# Patient Record
Sex: Female | Born: 1935 | Race: White | Hispanic: No | Smoking: Never smoker
Health system: Southern US, Community
[De-identification: ages and names within clinical notes are randomized; demographics above are authoritative.]

## PROBLEM LIST (undated history)

## (undated) DIAGNOSIS — I10 Essential (primary) hypertension: Secondary | ICD-10-CM

## (undated) HISTORY — PX: CHOLECYSTECTOMY: SHX55

---

## 2006-12-29 ENCOUNTER — Other Ambulatory Visit: Payer: Self-pay

## 2006-12-30 ENCOUNTER — Inpatient Hospital Stay: Payer: Self-pay | Admitting: Internal Medicine

## 2007-01-07 ENCOUNTER — Ambulatory Visit: Payer: Self-pay | Admitting: General Surgery

## 2007-06-08 ENCOUNTER — Ambulatory Visit: Payer: Self-pay | Admitting: Internal Medicine

## 2009-04-26 ENCOUNTER — Ambulatory Visit: Payer: Self-pay | Admitting: Family Medicine

## 2009-04-27 ENCOUNTER — Ambulatory Visit: Payer: Self-pay | Admitting: Internal Medicine

## 2010-11-06 ENCOUNTER — Ambulatory Visit: Payer: Self-pay | Admitting: Internal Medicine

## 2013-01-25 ENCOUNTER — Emergency Department: Payer: Self-pay | Admitting: Emergency Medicine

## 2013-01-25 LAB — URINALYSIS, COMPLETE
Bacteria: NONE SEEN
Bilirubin,UR: NEGATIVE
Blood: NEGATIVE
Glucose,UR: NEGATIVE mg/dL (ref 0–75)
Ketone: NEGATIVE
Nitrite: POSITIVE
RBC,UR: 2 /HPF (ref 0–5)

## 2013-01-25 LAB — CBC
HCT: 43 % (ref 35.0–47.0)
MCH: 32.4 pg (ref 26.0–34.0)
MCHC: 34.3 g/dL (ref 32.0–36.0)
Platelet: 148 10*3/uL — ABNORMAL LOW (ref 150–440)

## 2013-01-25 LAB — COMPREHENSIVE METABOLIC PANEL
Albumin: 3.6 g/dL (ref 3.4–5.0)
Alkaline Phosphatase: 96 U/L (ref 50–136)
Co2: 28 mmol/L (ref 21–32)
Creatinine: 0.86 mg/dL (ref 0.60–1.30)
EGFR (African American): >60
Glucose: 96 mg/dL (ref 65–99)
Osmolality: 280 (ref 275–301)
Potassium: 4.1 mmol/L (ref 3.5–5.1)

## 2013-03-24 LAB — CBC
HCT: 41.2 % (ref 35.0–47.0)
HGB: 14 g/dL (ref 12.0–16.0)
MCH: 31.9 pg (ref 26.0–34.0)
Platelet: 174 10*3/uL (ref 150–440)
RBC: 4.41 10*6/uL (ref 3.80–5.20)
WBC: 15.3 10*3/uL — ABNORMAL HIGH (ref 3.6–11.0)

## 2013-03-24 LAB — URINALYSIS, COMPLETE
Ph: 7 (ref 4.5–8.0)
Protein: 30
Specific Gravity: 1.019 (ref 1.003–1.030)
WBC UR: 2 /HPF (ref 0–5)

## 2013-03-24 LAB — BASIC METABOLIC PANEL
Anion Gap: 5 — ABNORMAL LOW (ref 7–16)
Chloride: 102 mmol/L (ref 98–107)
Co2: 30 mmol/L (ref 21–32)
Creatinine: 0.95 mg/dL (ref 0.60–1.30)
EGFR (African American): 59 — ABNORMAL LOW
EGFR (Non-African Amer.): 51 — ABNORMAL LOW
Glucose: 125 mg/dL — ABNORMAL HIGH (ref 65–99)
Osmolality: 276 (ref 275–301)
Potassium: 3.8 mmol/L (ref 3.5–5.1)
Sodium: 137 mmol/L (ref 136–145)

## 2013-03-24 LAB — PRO B NATRIURETIC PEPTIDE: B-Type Natriuretic Peptide: 5458 pg/mL — ABNORMAL HIGH (ref 0–450)

## 2013-03-25 ENCOUNTER — Inpatient Hospital Stay: Payer: Self-pay | Admitting: Family Medicine

## 2013-03-25 DIAGNOSIS — I359 Nonrheumatic aortic valve disorder, unspecified: Secondary | ICD-10-CM

## 2013-03-26 LAB — CBC WITH DIFFERENTIAL/PLATELET
Basophil #: 0.1 10*3/uL (ref 0.0–0.1)
Basophil %: 0.9 %
Eosinophil #: 0 10*3/uL (ref 0.0–0.7)
Eosinophil %: 0 %
HCT: 35.5 % (ref 35.0–47.0)
Lymphocyte #: 1.9 10*3/uL (ref 1.0–3.6)
Lymphocyte %: 17.4 %
MCHC: 34.7 g/dL (ref 32.0–36.0)
MCV: 93 fL (ref 80–100)
Monocyte #: 1.1 x10 3/mm — ABNORMAL HIGH (ref 0.2–0.9)
Monocyte %: 10 %
Neutrophil #: 8 10*3/uL — ABNORMAL HIGH (ref 1.4–6.5)
Neutrophil %: 71.7 %
Platelet: 136 10*3/uL — ABNORMAL LOW (ref 150–440)
RBC: 3.8 10*6/uL (ref 3.80–5.20)

## 2013-03-26 LAB — BASIC METABOLIC PANEL
BUN: 12 mg/dL (ref 7–18)
Calcium, Total: 8.8 mg/dL (ref 8.5–10.1)
Chloride: 105 mmol/L (ref 98–107)
Co2: 26 mmol/L (ref 21–32)
Osmolality: 273 (ref 275–301)
Potassium: 3.2 mmol/L — ABNORMAL LOW (ref 3.5–5.1)

## 2013-03-29 LAB — BASIC METABOLIC PANEL
Calcium, Total: 9.7 mg/dL (ref 8.5–10.1)
Chloride: 106 mmol/L (ref 98–107)
Co2: 32 mmol/L (ref 21–32)
Creatinine: 0.68 mg/dL (ref 0.60–1.30)
EGFR (Non-African Amer.): >60
Glucose: 91 mg/dL (ref 65–99)
Osmolality: 284 (ref 275–301)
Sodium: 143 mmol/L (ref 136–145)

## 2013-03-29 LAB — PLATELET COUNT: Platelet: 177 10*3/uL (ref 150–440)

## 2013-07-10 ENCOUNTER — Inpatient Hospital Stay: Payer: Self-pay | Admitting: Internal Medicine

## 2013-07-10 LAB — BASIC METABOLIC PANEL
Anion Gap: 2 — ABNORMAL LOW (ref 7–16)
BUN: 13 mg/dL (ref 7–18)
Calcium, Total: 9.7 mg/dL (ref 8.5–10.1)
Chloride: 102 mmol/L (ref 98–107)
Co2: 29 mmol/L (ref 21–32)
Creatinine: 0.76 mg/dL (ref 0.60–1.30)
EGFR (African American): >60
EGFR (Non-African Amer.): >60
Glucose: 119 mg/dL — ABNORMAL HIGH (ref 65–99)
Osmolality: 268 (ref 275–301)
Potassium: 3.6 mmol/L (ref 3.5–5.1)
Sodium: 133 mmol/L — ABNORMAL LOW (ref 136–145)

## 2013-07-10 LAB — CBC WITH DIFFERENTIAL/PLATELET
Basophil #: 0 10*3/uL (ref 0.0–0.1)
Basophil %: 0.3 %
Eosinophil #: 0 10*3/uL (ref 0.0–0.7)
Eosinophil %: 0 %
HCT: 43.1 % (ref 35.0–47.0)
HGB: 14.4 g/dL (ref 12.0–16.0)
Lymphocyte #: 1.4 10*3/uL (ref 1.0–3.6)
Lymphocyte %: 14.5 %
MCH: 31.6 pg (ref 26.0–34.0)
MCHC: 33.4 g/dL (ref 32.0–36.0)
MCV: 95 fL (ref 80–100)
Monocyte #: 1.3 x10 3/mm — ABNORMAL HIGH (ref 0.2–0.9)
Monocyte %: 13.8 %
Neutrophil #: 6.8 10*3/uL — ABNORMAL HIGH (ref 1.4–6.5)
Neutrophil %: 71.4 %
Platelet: 122 10*3/uL — ABNORMAL LOW (ref 150–440)
RBC: 4.56 10*6/uL (ref 3.80–5.20)
RDW: 13.6 % (ref 11.5–14.5)
WBC: 9.6 10*3/uL (ref 3.6–11.0)

## 2013-07-10 LAB — TROPONIN I: Troponin-I: 0.04 ng/mL

## 2013-07-11 LAB — CBC WITH DIFFERENTIAL/PLATELET
Basophil #: 0 10*3/uL (ref 0.0–0.1)
Basophil %: 0.3 %
Eosinophil #: 0 10*3/uL (ref 0.0–0.7)
Eosinophil %: 0 %
HCT: 39.1 % (ref 35.0–47.0)
HGB: 13.4 g/dL (ref 12.0–16.0)
Lymphocyte #: 1.2 10*3/uL (ref 1.0–3.6)
Lymphocyte %: 14.1 %
MCH: 32.1 pg (ref 26.0–34.0)
MCHC: 34.2 g/dL (ref 32.0–36.0)
MCV: 94 fL (ref 80–100)
MONO ABS: 1.1 x10 3/mm — AB (ref 0.2–0.9)
Monocyte %: 12.9 %
NEUTROS ABS: 6.4 10*3/uL (ref 1.4–6.5)
Neutrophil %: 72.7 %
Platelet: 108 10*3/uL — ABNORMAL LOW (ref 150–440)
RBC: 4.17 10*6/uL (ref 3.80–5.20)
RDW: 13.5 % (ref 11.5–14.5)
WBC: 8.8 10*3/uL (ref 3.6–11.0)

## 2013-07-11 LAB — BASIC METABOLIC PANEL
ANION GAP: 6 — AB (ref 7–16)
BUN: 9 mg/dL (ref 7–18)
CO2: 27 mmol/L (ref 21–32)
Calcium, Total: 9.7 mg/dL (ref 8.5–10.1)
Chloride: 105 mmol/L (ref 98–107)
Creatinine: 0.67 mg/dL (ref 0.60–1.30)
EGFR (Non-African Amer.): >60
Glucose: 111 mg/dL — ABNORMAL HIGH (ref 65–99)
Osmolality: 275 (ref 275–301)
POTASSIUM: 3.2 mmol/L — AB (ref 3.5–5.1)
Sodium: 138 mmol/L (ref 136–145)

## 2013-07-15 LAB — CULTURE, BLOOD (SINGLE)

## 2013-11-23 ENCOUNTER — Ambulatory Visit: Payer: Self-pay | Admitting: Internal Medicine

## 2013-11-23 LAB — URINALYSIS, COMPLETE
Bilirubin,UR: NEGATIVE
Blood: NEGATIVE
Glucose,UR: NEGATIVE mg/dL (ref 0–75)
Ketone: NEGATIVE
Nitrite: POSITIVE
Ph: 7.5 (ref 4.5–8.0)
Protein: NEGATIVE
Specific Gravity: 1.005 (ref 1.003–1.030)
WBC UR: >30 /HPF (ref 0–5)

## 2013-11-25 LAB — URINE CULTURE

## 2013-12-09 ENCOUNTER — Ambulatory Visit: Payer: Self-pay

## 2014-09-23 NOTE — Discharge Summary (Signed)
PATIENT NAME:  Olivia Smith, Olivia Smith MR#:  [id number] DATE OF BIRTH:  [birth year]  DATE OF ADMISSION:  03/25/2013 DATE OF DISCHARGE:  03/29/2013   CHIEF COMPLAINT:  Confusion and generalized weakness.   DISCHARGE DIAGNOSES: 1.  Altered mental status due to metabolic encephalopathy secondary to infection.  2.  Community-acquired pneumonia.  3.  Generalized weakness.  4.  Difficulty ambulating.  5.  Hypertension.  6.  Osteoporosis.  7.  Leukocytosis.  8.  Hypokalemia.   DISPOSITION:  Skilled nursing facility.   MEDICATIONS AT DISCHARGE: Aspirin 81 mg daily, atenolol 25 mg daily, calcium plus vitamin D once daily, losartan 25 mg daily, chondroitin with glucosamine once a day, albuterol 2.5 mg every 4 hours as needed for wheezing, Augmentin 875 mg/125 mg twice daily for 8 days,  azithromycin 250 mg once a day for 8 days.   FOLLOWUP:  With [facility] PCP at discharge from facility. For now, follow up with attending physician at skilled nursing facility in the next couple of days.   HOSPITAL COURSE: A very nice day 81 year old female with history of hypertension and osteoporosis who was admitted with weakness and confusion.   The patient presented on 03/24/2013, in the company of her daughter, who brought her in because of progressive weakness for 24 hours, mild confusion and cough.   The patient was noted to have some wheezing and an elevated d-dimer. She had a CT scan of the chest with IV contrast, negative for PE, positive for right basilar infiltrate. The patient received treatment for pneumonia with Rocephin and azithromycin. She had significant improvement right away. The patient was diagnosed with community-acquired pneumonia She had good progress and good improvement. She did not have blood cultures on admission. By the time I saw her, she was already on several days of antibiotics.   The patient had no need of supplemental oxygen. She is on room air right now. She is not able to ambulate due to  severe weakness, for which physical therapy recommended skilled nursing facility.   The patient had significant improvement of her white blood cells after antibiotics, from 15,000 to 11,000, showed some significant improvement. The patient is discharged to a facility in good condition. She spent at least 3 days in the hospital getting IV antibiotics. Now, they have been switched to oral therapy.   I spent about 40 minutes with this discharge.   ____________________________ Felipa Furnaceoberto Sanchez Gutierrez, MD rsg:dmm D: 03/29/2013 12:37:00 ET T: 03/29/2013 13:28:12 ET JOB#: [id number]  cc: Felipa Furnaceoberto Sanchez Gutierrez, MD, <Dictator> Alice Vitelli Juanda ChanceSANCHEZ GUTIERRE MD ELECTRONICALLY SIGNED 04/12/2013 6:46

## 2014-09-23 NOTE — H&P (Signed)
PATIENT NAME:  Olivia Smith, Olivia Smith MR#:  [id number] DATE OF BIRTH:  [birth year]  DATE OF ADMISSION:  03/24/2013  REFERRING PHYSICIAN: Maurilio LovelyNoelle McLaurin, M.D.  [facility].   CHIEF COMPLAINT: Weakness, confusion.   HISTORY OF PRESENT ILLNESS: This is a 81 year old female with significant past medical history of hypertension, osteoporosis. The patient lives with her daughter at home. She was brought by her daughter for progressive weakness over the last 24 hours, as well some mild confusion and cough. Upon presentation, the patient was noticed to have some wheezing. Had an elevated D-dimer. The patient had CT chest with IV contrast. Came back negative for PE but did show right basilar possible infiltrate. The patient appears to be confused. Daughter at bedside. She gives most of the medical history and review of systems. Reports usually her mother is much more sharper than that. The patient's only aware of her name. Not aware of location or date currently. Daughter reports the patient was in August in [facility] where she was diagnosed with a UTI, discharged on p.o. antibiotics, where she went back to her baseline. Daughter denies the patient having any focal deficits but being generally weak, where she did require two people to assist her to stand where usually she ambulates without assistance, and the patient cannot give any reliable review of systems. Basically, she is denying everything which does not appear to be accurate as daughter mentions her mom has been having cough for the last 2 days but no productive sputum. Reports she has been having good appetite but has been feeling weak. Denied her having any fever. Hospitalist service was requested to admit the patient for further treatment of her pneumonia.   PAST MEDICAL HISTORY:  1. Hypertension.  2. Osteoporosis.    SOCIAL HISTORY: Lives with her daughter. No history of smoking or alcohol use.   FAMILY HISTORY:  Significant for father dying of tuberculosis, her sister having a diagnosis of tuberculosis.   ALLERGIES: No known drug allergies.   HOME MEDICATIONS:  1. Aspirin 81 mg oral daily.  2. Atenolol 25 mg oral daily.  3. Losartan 25 mg oral daily.  4. Chondroitin and glucosamine 1 tablet oral daily.  5. Calcium with vitamin D 1 tablet oral daily.   REVIEW OF SYSTEMS: The patient appears to be confused. Cannot give reliable review of systems, and basically, she is answering no to every single question we ask as she denies any complaints, while daughter states otherwise.   PHYSICAL EXAMINATION:  VITAL SIGNS: Temperature 97.9, pulse 75, respiratory rate 18, blood pressure 114/62, saturating 98% on room air.  GENERAL: Frail elderly female, looks comfortable, in no apparent distress.  HEENT: Head atraumatic, normocephalic. Pupils are equal and reactive to light. Pink conjunctivae. Anicteric sclerae. Dry oral mucosa.  NECK: Supple. No thyromegaly. No JVD.  CHEST: Good air entry bilaterally. No wheezing but had bibasilar rhonchi.  CARDIOVASCULAR: S1, S2 heard. No rubs, murmurs or gallops.  ABDOMEN: Soft, nontender, nondistended. Bowel sounds present.  EXTREMITIES: No edema. No clubbing. No cyanosis. Good pedal pulses felt bilaterally.  SKIN: Has rash in the left knee area where she has been using hydrocortisone with improvement.  PSYCHIATRIC: The patient appears to be confused. Awake, alert x 1 only to name.  NEUROLOGICAL: Appears to be moving all extremities without significant deficits. Cranial nerves grossly intact.  LYMPHATIC: No cervical or supraclavicular lymphadenopathy.   PERTINENT LABORATORIES: Glucose 125, BNP 5458, BUN 14, creatinine 0.95, sodium 137, potassium 3.8, chloride  102, CO2 30. Troponin less than 0.02. White blood cells 15.3, hemoglobin 14, hematocrit 41.2, platelets 174. D-dimer 3.06. Urinalysis negative for leukocyte esterase and nitrites.   CT chest with IV contrast showing  no evidence of pulmonary embolism. No thoracic aortic aneurysm or dissection and presumed patchy areas of infiltrate at the lung bases.   ASSESSMENT AND PLAN:  1. Community-acquired pneumonia: The patient presents with weakness. Has cough with leukocytosis and evidence of bibasilar infiltrate on the CT chest. She will be started on Rocephin and Zithromax.  2. Generalized weakness, confusion: This is due to her pneumonia. Will continue with fluids. Will treat her pneumonia. Will consult physical therapy.  3. Hypertension, acceptable: Continue the patient on Cozaar and atenolol.  4. Osteoporosis: Continue with calcium and vitamin D.  5. Deep vein thrombosis prophylaxis: Subcutaneous heparin.   CODE STATUS: The daughter at bedside and reports the patient is DNR/DNI.   TOTAL TIME SPENT ON ADMISSION AND PATIENT CARE: 45 minutes.   ____________________________ Starleen Arms, MD dse:gb D: 03/25/2013 01:56:59 ET T: 03/25/2013 02:13:08 ET JOB#: [id number]  cc: Starleen Arms, MD, <Dictator> Mateen Franssen Teena Irani MD ELECTRONICALLY SIGNED 03/26/2013 3:06

## 2014-09-24 NOTE — H&P (Signed)
PATIENT NAME:  Olivia Smith, Olivia Smith MR#:  [id number] DATE OF BIRTH:  1917/03/28  DATE OF ADMISSION:  07/10/2013  PRIMARY CARE PHYSICIAN: At [facility].   REFERRING PHYSICIAN: Dr. Governor Rooks.   CHIEF COMPLAINT: Cough, shortness of breath and fever.    HISTORY OF PRESENT ILLNESS:  Olivia Smith is a 81 year old pleasant female with history of hypertension, osteoporosis, presented to the Emergency Department with complaints of cough, shortness of breath; started for the last two days. Concerning this, called yesterday [facility] where patient was given Zithromax. The patient had significantly decreased p.o. intake, has cough with productive sputum. The patient is unable to ambulate in the house. The patient continues to become extremely lethargic. Concerning this, the patient is brought to the Emergency Department. Work-up in the Emergency Department with chest x-ray showed possibly mild interstitial edema superimposed on changes of the COPD.  The patient does not have any elevated white blood cell count. The patient has had low grade temperature of 99.7, tachycardic, hypoxic with oxygen saturations in high 80s. The patient was initiated on 2 liters of oxygen. The patient received Rocephin and Zithromax in the Emergency Department.   PAST MEDICAL HISTORY: 1.  Hypertension.  2.  Osteoporosis.  3.  Previous history of pneumonia.   PAST SURGICAL HISTORY: 1.  Cholecystectomy.  2.  Hernia repair.   ALLERGIES: No known drug allergies.   HOME MEDICATIONS: 1.  Zithromax 500 mg once a day.  2.  Vitamin D3 400 units once a day.  3.  Losartan 25 mg once a day.  4.  Chondroitin glucosamine 1 tablet once a day.  5.  Aspirin 81 mg daily.   SOCIAL HISTORY:  Never smoking, drinking alcohol or using illicit drugs. Currently lives with her daughter. No secondhand exposure to smoking.   FAMILY HISTORY: Multiple family members had tuberculosis in 36s and '50s.    REVIEW OF SYSTEMS: Could not be  obtained from the patient, as the patient is extremely lethargic and confused.   PHYSICAL EXAMINATION: GENERAL: This is a frail female lying down in the bed, looks extremely lethargic.  VITAL SIGNS: Temperature 99.7, pulse 110, blood pressure 143/70, respiratory rate of 18, oxygen saturation 93% on 2 liters of oxygen.  HEENT: Head normocephalic, atraumatic. There is no scleral icterus. Conjunctivae normal. Pupils equal and react to light. Mucous membranes dry. No pharyngeal erythema.  NECK: Supple. No lymphadenopathy. No JVD. No carotid bruit.  CHEST: No focal tenderness. Bilateral crackles are heard, has bilateral rhonchi.  HEART: S1, S2, regular. Tachycardia.  EXTREMITIES: No pedal edema. Pulses 2+.  ABDOMEN: Bowel sounds present. Soft, nontender, nondistended. No hepatosplenomegaly.  SKIN: No rash or lesions, dry skin.  MUSCULOSKELETAL: Good range of motion in all the extremities.  NEUROLOGIC: The patient is alert, oriented to person, but not to place and time. Motor 5/5 in upper and lower extremities.   LABS: CBC: WBC of 9.6, hemoglobin 14.4, platelet count of 122, troponin 0.04. Chest x-ray, 1 view portable; as mentioned above, interstitial changes.   BMP is completely within normal limits.   ASSESSMENT AND PLAN: Ms. Raphael is a 81 year old female who comes to the Emergency Department with cough, productive sputum, lethargy and possible pneumonia.  1.  Pneumonia. Will treat it as a community-acquired pneumonia with Rocephin and Zithromax. Continue with IV fluids.  2. Hypertension: Currently well controlled. Will hold the blood pressure medications. The patient has decreased p.o. intake.  3.   Debility. We will involve physical therapy, occupational therapy.  4.  Keep the patient on deep vein thrombosis prophylaxis with Lovenox.   TIME SPENT: 45 minutes.    ____________________________ Susa GriffinsPadmaja Clemmie Marxen, MD pv:NTS D: 07/10/2013 22:18:00 ET T: 07/10/2013 22:57:37  ET JOB#: [id number]  cc: Susa GriffinsPadmaja Joenathan Sakuma, MD, <Dictator> Susa GriffinsPADMAJA Nyiesha Beever MD ELECTRONICALLY SIGNED 08/05/2013 1:17

## 2014-09-24 NOTE — Discharge Summary (Signed)
PATIENT NAME:  Olivia Smith, Olivia MR#:  [id number] DATE OF BIRTH:  [birth year]  DATE OF ADMISSION:  07/10/2013 DATE OF DISCHARGE:  07/14/2013  ADMISSION DIAGNOSIS: Pneumonia.   DISCHARGE DIAGNOSES: 1. Community-acquired pneumonia.  2. Acute respiratory failure.  3. Debility.  4. Hypokalemia.  5. Hyponatremia.   Chest x-ray showed a right upper lobe infiltrate consistent with pneumonia. Discharge white blood cells 8.8, hemoglobin 13.4, hematocrit 39.1, platelets are 108.  Blood cultures negative to date.   HOSPITAL COURSE: A 81 year old female who presented with respiratory failure, hypoxia, found to have a pneumonia. For further details, please refer to H and P.  1. Acute respiratory failure. The patient is admitted for community-acquired pneumonia. She was placed on antibiotics and oxygen. Please see below.  2. Community acquired pneumonia.  Chest x-ray shows a left upper lobe as well as right upper lobe pneumonia. The patient was on antibiotics Rocephin and azithromycin, will be transitioned to Levaquin at discharge. She does need oxygen at discharge. Blood cultures negative to date.  3. Debility. Physical therapy recommends skilled nursing.  4. Hypokalemia. The patient was repleted.  5. Hyponatremia, improved with IV fluids.   PHYSICAL EXAMINATION: VITAL SIGNS: Temperature 98, pulse 86 to 100, respirations 20, blood pressure 146 to 172 over 80 to 87, 92% on 2 liters.  GENERAL: The patient is alert and oriented. She is hard of hearing, does not appear to be in acute distress.  HEENT: Pupils are round and reactive.   NECK: Supple. There is no JVD, carotid bruit.  CARDIOVASCULAR: Tachycardia without murmur, gallops, or rubs. No lower extremity edema.  RESPIRATORY: The patient has normal respiratory effort, no use of accessory muscles. She has some rhonchi and crackles at the right and left base.  ABDOMEN: Bowel sounds are positive. Nontender, and nondistended.  EXTREMITIES: No edema.   SKIN: Without ulcers or rashes.  NEUROLOGIC: Cranial nerves II through XII are intact. There is no tremor. Follows commands. She has generalized weakness.  PSYCHIATRIC: She is alert.   DISCHARGE MEDICATIONS: 1. Vitamin D3 400 international units daily.  2. Chondroitin  1200/1500 daily.  3. Losartan 25 mg daily.  4. Aspirin 81 mg daily.  5. Levaquin 750 mg q.24 hours x 7 days.  6. Discharge oxygen: 2 liters nasal cannula keeping sats greater than 92%.   DISCHARGE SUPPLEMENT: Ensure once a day.   DISCHARGE DIET: Regular diet.   DISCHARGE ACTIVITY: As tolerated.   DISCHARGE REFERRAL: Physical therapy.  The patient is  medically stable for discharge.   TIME SPENT: Approximately 35 minutes.  ____________________________ Janyth ContesSital P. Juliene PinaMody, MD spm:sg D: 07/14/2013 12:43:13 ET T: 07/14/2013 13:11:59 ET JOB#: [id number]  cc: Jilberto Vanderwall P. Juliene PinaMody, MD, <Dictator> [facility]  Janyth ContesSITAL P Leonel Mccollum MD ELECTRONICALLY SIGNED 07/14/2013 14:36

## 2015-02-27 ENCOUNTER — Emergency Department: Payer: Medicare Other

## 2015-02-27 ENCOUNTER — Emergency Department
Admission: EM | Admit: 2015-02-27 | Discharge: 2015-02-27 | Disposition: A | Discharge status: Home / Self Care | Payer: Medicare Other | Attending: Emergency Medicine | Admitting: Emergency Medicine

## 2015-02-27 DIAGNOSIS — I10 Essential (primary) hypertension: Secondary | ICD-10-CM | POA: Insufficient documentation

## 2015-02-27 DIAGNOSIS — R05 Cough: Secondary | ICD-10-CM | POA: Diagnosis not present

## 2015-02-27 DIAGNOSIS — R410 Disorientation, unspecified: Secondary | ICD-10-CM | POA: Insufficient documentation

## 2015-02-27 DIAGNOSIS — R4182 Altered mental status, unspecified: Secondary | ICD-10-CM | POA: Diagnosis present

## 2015-02-27 HISTORY — DX: Essential (primary) hypertension: I10

## 2015-02-27 LAB — CBC
HEMATOCRIT: 39.8 % (ref 35.0–47.0)
HEMOGLOBIN: 13.2 g/dL (ref 12.0–16.0)
MCH: 30.8 pg (ref 26.0–34.0)
MCHC: 33.2 g/dL (ref 32.0–36.0)
MCV: 92.8 fL (ref 80.0–100.0)
Platelets: 150 10*3/uL (ref 150–440)
RBC: 4.29 MIL/uL (ref 3.80–5.20)
RDW: 13.5 % (ref 11.5–14.5)
WBC: 9.1 10*3/uL (ref 3.6–11.0)

## 2015-02-27 LAB — BASIC METABOLIC PANEL
Anion gap: 7 (ref 5–15)
BUN: 13 mg/dL (ref 6–20)
CHLORIDE: 103 mmol/L (ref 101–111)
CO2: 27 mmol/L (ref 22–32)
CREATININE: 0.76 mg/dL (ref 0.44–1.00)
Calcium: 10.1 mg/dL (ref 8.9–10.3)
GFR calc Af Amer: >60 mL/min (ref >60)
GFR calc non Af Amer: >60 mL/min (ref >60)
GLUCOSE: 104 mg/dL — AB (ref 65–99)
POTASSIUM: 4.2 mmol/L (ref 3.5–5.1)
SODIUM: 137 mmol/L (ref 135–145)

## 2015-02-27 LAB — URINALYSIS COMPLETE WITH MICROSCOPIC (ARMC ONLY)
BACTERIA UA: NONE SEEN
Bilirubin Urine: NEGATIVE
Glucose, UA: NEGATIVE mg/dL
KETONES UR: NEGATIVE mg/dL
Leukocytes, UA: NEGATIVE
Nitrite: NEGATIVE
PH: 6 (ref 5.0–8.0)
PROTEIN: NEGATIVE mg/dL
Specific Gravity, Urine: 1.01 (ref 1.005–1.030)

## 2015-02-27 NOTE — Discharge Instructions (Signed)
Confusion Confusion is the inability to think with your usual speed or clarity. Confusion may come on quickly or slowly over time. How quickly the confusion comes on depends on the cause. Confusion can be due to any number of causes. CAUSES   Concussion, head injury, or head trauma.  Seizures.  Stroke.  Fever.  Brain tumor.  Age related decreased brain function (dementia).  Heightened emotional states like rage or terror.  Mental illness in which the person loses the ability to determine what is real and what is not (hallucinations).  Infections such as a urinary tract infection (UTI).  Toxic effects from alcohol, drugs, or prescription medicines.  Dehydration and an imbalance of salts in the body (electrolytes).  Lack of sleep.  Low blood sugar (diabetes).  Low levels of oxygen from conditions such as chronic lung disorders.  Drug interactions or other medicine side effects.  Nutritional deficiencies, especially niacin, thiamine, vitamin C, or vitamin B.  Sudden drop in body temperature (hypothermia).  Change in routine, such as when traveling or hospitalized. SIGNS AND SYMPTOMS  People often describe their thinking as cloudy or unclear when they are confused. Confusion can also include feeling disoriented. That means you are unaware of where or who you are. You may also not know what the date or time is. If confused, you may also have difficulty paying attention, remembering, and making decisions. Some people also act aggressively when they are confused.  DIAGNOSIS  The medical evaluation of confusion may include:  Blood and urine tests.  X-rays.  Brain and nervous system tests.  Analyzing your brain waves (electroencephalogram or EEG).  Magnetic resonance imaging (MRI) of your head.  Computed tomography (CT) scan of your head.  Mental status tests in which your health care provider may ask many questions. Some of these questions may seem silly or strange,  but they are a very important test to help diagnose and treat confusion. TREATMENT  An admission to the hospital may not be needed, but a person with confusion should not be left alone. Stay with a family member or friend until the confusion clears. Avoid alcohol, pain relievers, or sedative drugs until you have fully recovered. Do not drive until directed by your health care provider. HOME CARE INSTRUCTIONS  What family and friends can do:  To find out if someone is confused, ask the person to state his or her name, age, and the date. If the person is unsure or answers incorrectly, he or she is confused.  Always introduce yourself, no matter how well the person knows you.  Often remind the person of his or her location.  Place a calendar and clock near the confused person.  Help the person with his or her medicines. You may want to use a pill box, an alarm as a reminder, or give the person each dose as prescribed.  Talk about current events and plans for the day.  Try to keep the environment calm, quiet, and peaceful.  Make sure the person keeps follow-up visits with his or her health care provider. PREVENTION  Ways to prevent confusion:  Avoid alcohol.  Eat a balanced diet.  Get enough sleep.  Take medicine only as directed by your health care provider.  Do not become isolated. Spend time with other people and make plans for your days.  Keep careful watch on your blood sugar levels if you are diabetic. SEEK IMMEDIATE MEDICAL CARE IF:   You develop severe headaches, repeated vomiting, seizures, blackouts, or   slurred speech.  There is increasing confusion, weakness, numbness, restlessness, or personality changes.  You develop a loss of balance, have marked dizziness, feel uncoordinated, or fall.  You have delusions, hallucinations, or develop severe anxiety.  Your family members think you need to be rechecked. Document Released: 06/27/2004 Document Revised: 10/04/2013  Document Reviewed: 06/25/2013 [employer] Patient Information 2015 [employer]. This information is not intended to replace advice given to you by your health care provider. Make sure you discuss any questions you have with your health care provider.  

## 2015-02-27 NOTE — ED Provider Notes (Signed)
[facility] Emergency Department Provider Note  ____________________________________________  Time seen: Approximately 155 AM  I have reviewed the triage vital signs and the nursing notes.   HISTORY  Chief Complaint Altered Mental Status    HPI Olivia Smith is a 81 y.o. female who was brought in with confusion. EMS was called for possible stroke. The patient's daughter reports that her mother woke up to use the restroom and when she walked back to bed she was talking out of her head.The patient made a statement about how she couldn't talk to God that her daughter did not understand. She reports that she made a few other statements that didn't make any sense so she was concerned that the patient may be having a stroke so she called the ambulance. The patient's daughter also reports that this afternoon after church the patient's seemed to have some difficulty breathing and a cough. Per EMS the patient walked to the stretcher without any difficulty and had fluent speech. The patient has no complaints in the Emergency Department at this time. The patient recently completed a course of bactrim for a UTI    Past Medical History  Diagnosis Date  . Hypertension     There are no active problems to display for this patient.   Past Surgical History  Procedure Laterality Date  . Cholecystectomy      No current outpatient prescriptions on file.  Allergies Review of patient's allergies indicates no known allergies.  No family history on file.  Social History Social History  Substance Use Topics  . Smoking status: Never Smoker   . Smokeless tobacco: Never Used  . Alcohol Use: No    Review of Systems Constitutional: No fever/chills Eyes: No visual changes. ENT: No sore throat. Cardiovascular: Denies chest pain. Respiratory: Cough Gastrointestinal: No abdominal pain.  No nausea, no vomiting.  No diarrhea.  No constipation. Genitourinary: Negative for  dysuria. Musculoskeletal: Negative for back pain. Skin: Negative for rash. Neurological: Negative for headaches, focal weakness or numbness.  10-point ROS otherwise negative.  ____________________________________________   PHYSICAL EXAM:  VITAL SIGNS: ED Triage Vitals  Enc Vitals Group     BP 02/27/15 0159 152/75 mmHg     Pulse Rate 02/27/15 0159 101     Resp 02/27/15 0159 19     Temp 02/27/15 0159 98.1 F (36.7 C)     Temp Source 02/27/15 0159 Oral     SpO2 02/27/15 0159 95 %     Weight 02/27/15 0250 127 lb (57.607 kg)     Height 02/27/15 0250  (1.651 m)     Head Cir --      Peak Flow --      Pain Score 02/27/15 0202 0     Pain Loc --      Pain Edu? --      Excl. in GC? --     Constitutional: Alert and oriented to self, partially to date. Well appearing and in no acute distress. Eyes: Conjunctivae are normal. PERRL. EOMI. Head: Atraumatic. Nose: No congestion/rhinnorhea. Mouth/Throat: Mucous membranes are moist.  Oropharynx non-erythematous. Cardiovascular: Normal rate, regular rhythm. Systolic murmur.  Good peripheral circulation. Respiratory: Normal respiratory effort.  No retractions. Lungs CTAB. Gastrointestinal: Soft and nontender. No distention. Positive bowel sounds Musculoskeletal: No lower extremity tenderness nor edema.   Neurologic:  Normal speech and language. No gross focal neurologic deficits are appreciated.  Skin:  Skin is warm, dry and intact.  Psychiatric: Mood and affect are normal.  ____________________________________________   LABS (all labs ordered are listed, but only abnormal results are displayed)  Labs Reviewed  BASIC METABOLIC PANEL - Abnormal; Notable for the following:    Glucose, Bld 104 (*)    All other components within normal limits  URINALYSIS COMPLETEWITH MICROSCOPIC ([facility] ONLY) - Abnormal; Notable for the following:    Color, Urine YELLOW (*)    APPearance CLEAR (*)    Hgb urine dipstick 1+ (*)    Squamous  Epithelial / LPF 0-5 (*)    All other components within normal limits  CBC   ____________________________________________  EKG  ED ECG REPORT I, Rebecka Apley, the attending physician, personally viewed and interpreted this ECG.   Date: 02/27/2015  EKG Time: 207  Rate: 96  Rhythm: normal sinus rhythm  Axis: normal  Intervals:none  ST&T Change: none  ____________________________________________  RADIOLOGY  CT head: No acute finding or change since 2014 CXR: Chronic lung disease without acute superimposed finding ____________________________________________   PROCEDURES  Procedure(s) performed: None  Critical Care performed: No  ____________________________________________   INITIAL IMPRESSION / ASSESSMENT AND PLAN / ED COURSE  Pertinent labs & imaging results that were available during my care of the patient were reviewed by me and considered in my medical decision making (see chart for details).  This is a 81 year old female who was brought in by her daughter with some confusion earlier this evening. The patient has no specific complaints per her daughter was concerned she may be having a stroke. The patient's blood work is unremarkable as well as her CT and chest x-ray. The patient's urine also does not show any sign of infection. The patient does have some mild confusion about the specific date of the month as well as the date of her birthday but knows that she was born in [birth year] and it is Sunday in September. The patient also has no neurologic deficits at this time. Although the daughter asked if the patient can stay for the night I informed her that the patient had no specific need or reason to be kept in the hospital. I informed her that she should have the patient follow-up with her primary care physician for further evaluation of her confusion. Otherwise the patient will be discharged home to follow-up with her primary care  physician. ____________________________________________   FINAL CLINICAL IMPRESSION(S) / ED DIAGNOSES  Final diagnoses:  Confusion      Rebecka Apley, MD 02/27/15 [phone]

## 2015-02-27 NOTE — ED Notes (Signed)
Urine collected via in and out cath. Patient tolerated well. Daughter at bedside. Patient on monitor.

## 2015-02-27 NOTE — ED Notes (Signed)
Per daughter, patient was "talking out of her head tonight". Has been treated recently for a UTI. Stopped medication 2 days ago

## 2016-01-02 IMAGING — CT CT HEAD W/O CM
1 series · 16 of 30 positions shown, 20 images · non-contrast
Comparison: 01/25/2013

CLINICAL DATA: Confusion with cough and difficulty breathing.

EXAM:
CT HEAD WITHOUT CONTRAST
TECHNIQUE: Contiguous axial images were obtained from the base of the skull
through the vertex without intravenous contrast.

[Series 2: head wo · axial · 0.43mm/px · z∈[-142,-16]mm · 16 of 32 slices shown, 20 images]
[im 2/32  brain]
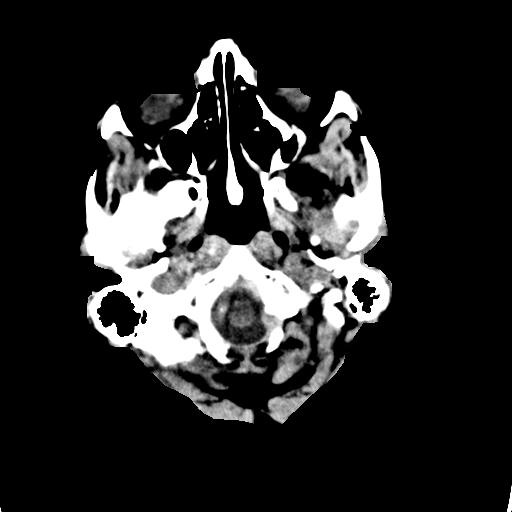
[im 2/32  bone]
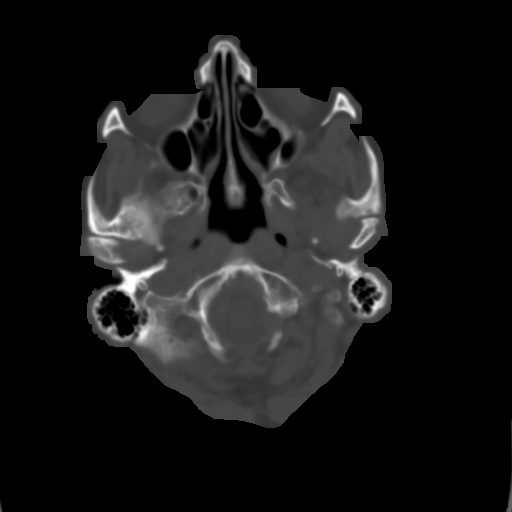
[im 4/32  brain]
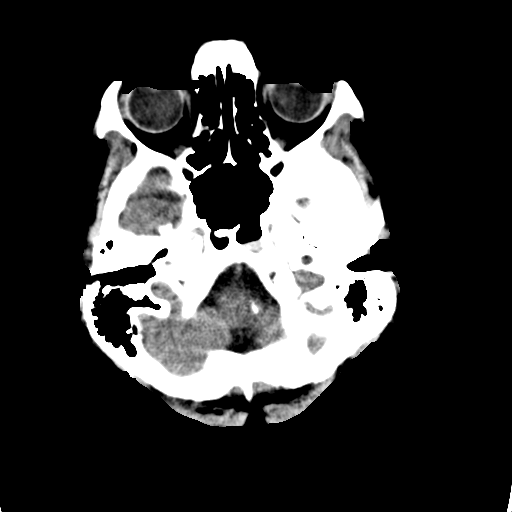
[im 6/32  brain]
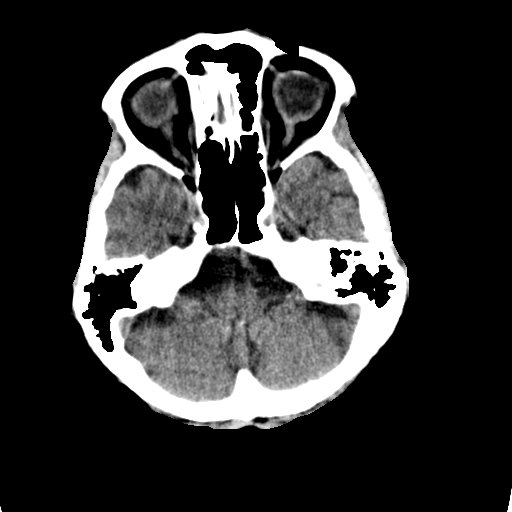
[im 8/32  brain]
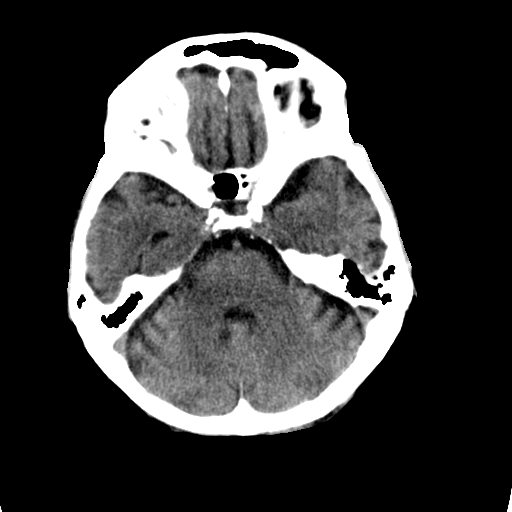
[im 9/32  brain]
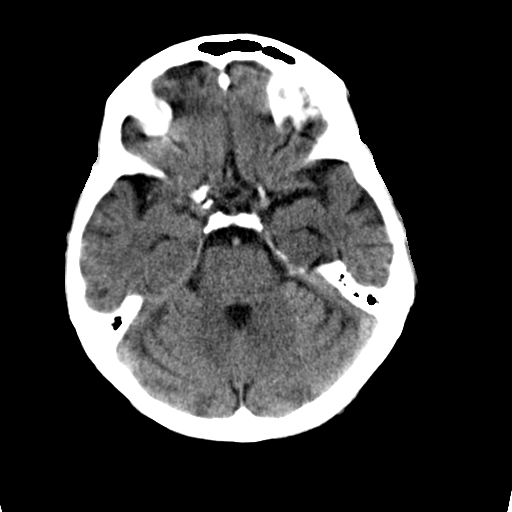
[im 9/32  bone]
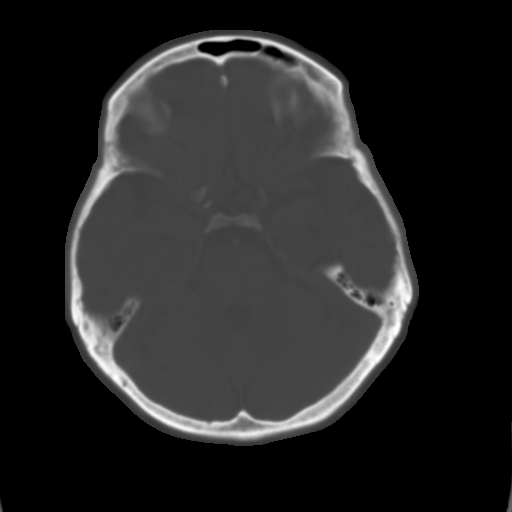
[im 11/32  brain]
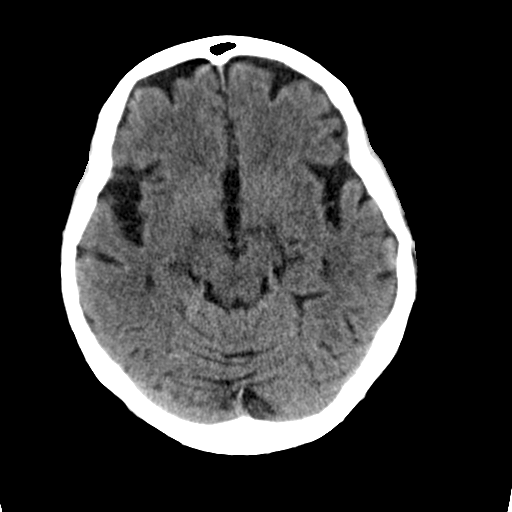
[im 13/32  brain]
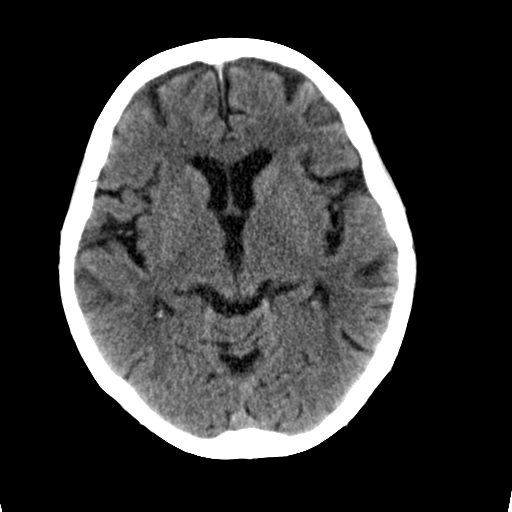
[im 15/32  brain]
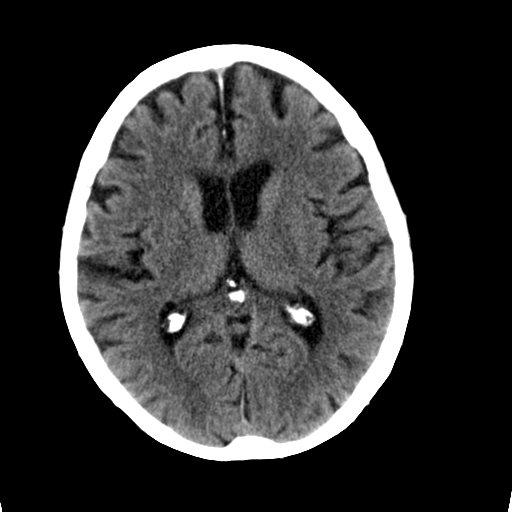
[im 17/32  brain]
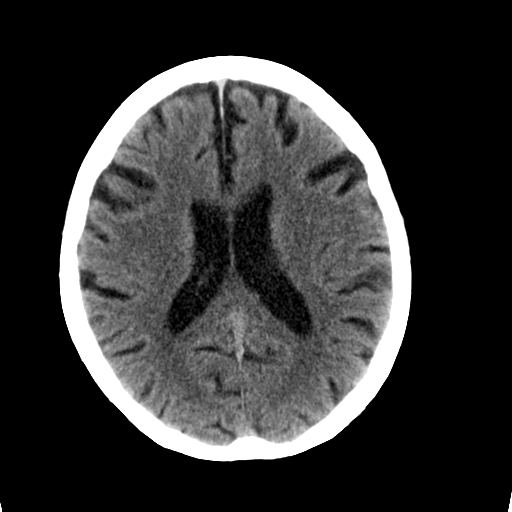
[im 17/32  bone]
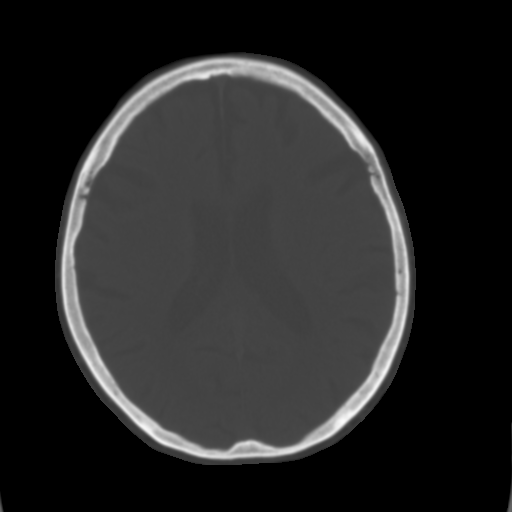
[im 19/32  brain]
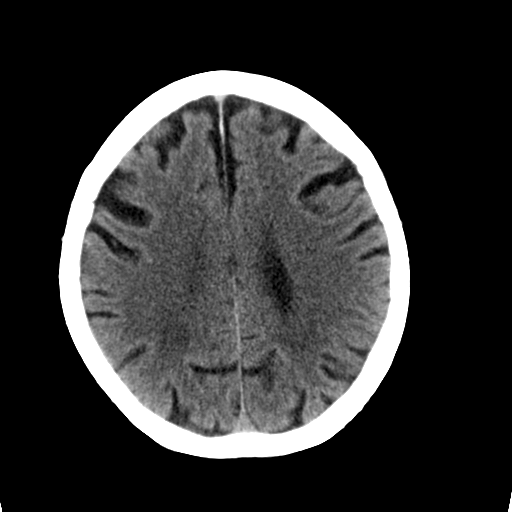
[im 21/32  brain]
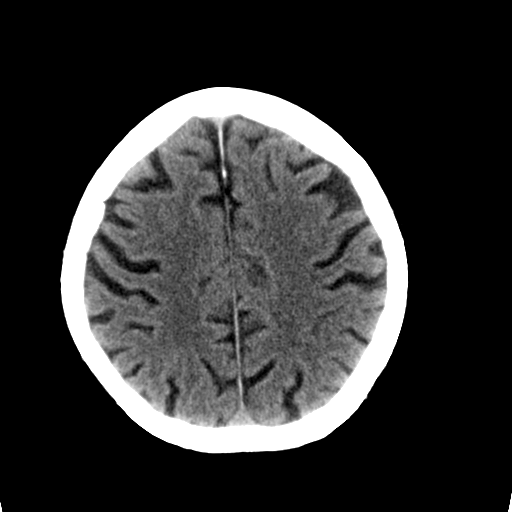
[im 23/32  brain]
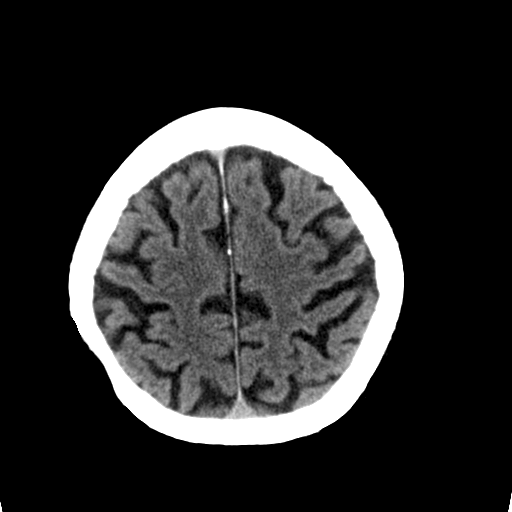
[im 24/32  brain]
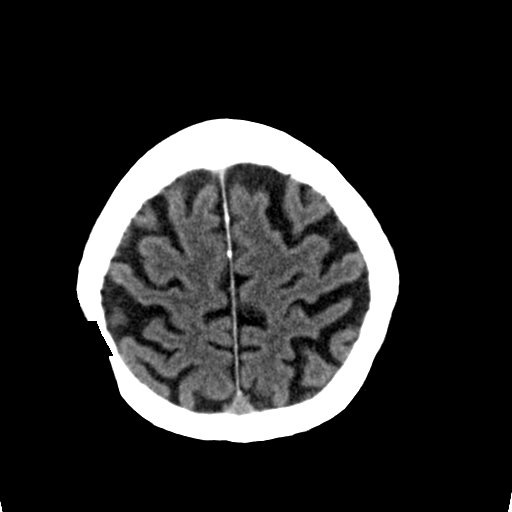
[im 24/32  bone]
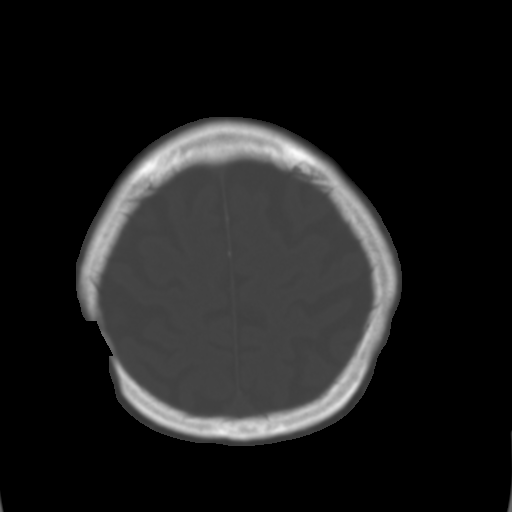
[im 26/32  brain]
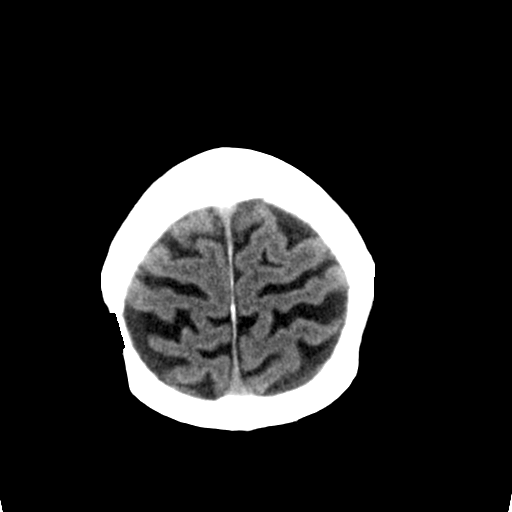
[im 28/32  brain]
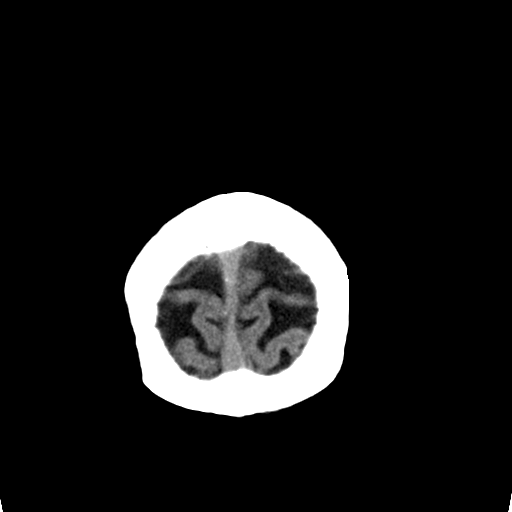
[im 30/32  brain]
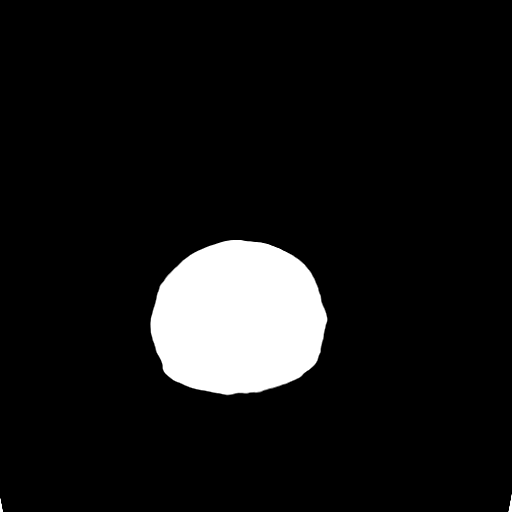

[16 of 30 positions shown; findings below may reference images not displayed]

FINDINGS: Skull and Sinuses:Chronic biparietal calvarium thinning. No acute
fracture or destructive process.

Small left sphenoid sinus effusion or mucosal thickening, also seen
in 2351.

Orbits: No acute abnormality.

Brain: No evidence of acute infarction, hemorrhage, hydrocephalus,
or mass lesion/mass effect.

Generalized volume loss and small vessel ischemic change in the
cerebral white matter, expected for age.
IMPRESSION: No acute finding or change since [DATE].

## 2016-05-03 DEATH — deceased
